# Patient Record
Sex: Female | Born: 1960 | Race: White | Hispanic: No | Marital: Married | Smoking: Never smoker
Health system: Southern US, Community
[De-identification: ages and names within clinical notes are randomized; demographics above are authoritative.]

## PROBLEM LIST (undated history)

## (undated) HISTORY — PX: HERNIA REPAIR: SHX51

## (undated) HISTORY — PX: BREAST BIOPSY: SHX20

---

## 2015-12-28 ENCOUNTER — Encounter: Payer: Self-pay | Admitting: *Deleted

## 2015-12-28 ENCOUNTER — Emergency Department
Admission: EM | Admit: 2015-12-28 | Discharge: 2015-12-28 | Disposition: A | Discharge status: Home / Self Care | Payer: BC Managed Care – PPO | Attending: Emergency Medicine | Admitting: Emergency Medicine

## 2015-12-28 ENCOUNTER — Emergency Department: Payer: BC Managed Care – PPO

## 2015-12-28 DIAGNOSIS — R0789 Other chest pain: Secondary | ICD-10-CM | POA: Diagnosis present

## 2015-12-28 DIAGNOSIS — Y9389 Activity, other specified: Secondary | ICD-10-CM | POA: Diagnosis not present

## 2015-12-28 DIAGNOSIS — Y999 Unspecified external cause status: Secondary | ICD-10-CM | POA: Insufficient documentation

## 2015-12-28 DIAGNOSIS — Y92410 Unspecified street and highway as the place of occurrence of the external cause: Secondary | ICD-10-CM | POA: Insufficient documentation

## 2015-12-28 LAB — TROPONIN I: Troponin I: <0.03 ng/mL (ref <0.031)

## 2015-12-28 MED ORDER — CYCLOBENZAPRINE HCL 10 MG PO TABS: 10.0000 mg | ORAL_TABLET | Freq: Three times a day (TID) | ORAL | Status: AC | PRN

## 2015-12-28 MED ORDER — NAPROXEN 500 MG PO TABS: 500.0000 mg | ORAL_TABLET | Freq: Two times a day (BID) | ORAL | Status: AC

## 2015-12-28 MED ORDER — CYCLOBENZAPRINE HCL 10 MG PO TABS
10.0000 mg | ORAL_TABLET | Freq: Once | ORAL | Status: AC
  Administered 2015-12-28: 10 mg via ORAL
  Filled 2015-12-28: qty 1

## 2015-12-28 NOTE — ED Notes (Signed)
Patient transported to Xray, will draw blood once returns.

## 2015-12-28 NOTE — ED Notes (Signed)
Lab called regarding trop, will process at this time.
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
## 2015-12-28 NOTE — Discharge Instructions (Signed)
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
## 2020-02-03 ENCOUNTER — Other Ambulatory Visit: Payer: Self-pay

## 2020-02-03 ENCOUNTER — Ambulatory Visit (LOCAL_COMMUNITY_HEALTH_CENTER): Payer: Self-pay

## 2020-02-03 DIAGNOSIS — Z111 Encounter for screening for respiratory tuberculosis: Secondary | ICD-10-CM

## 2020-02-06 ENCOUNTER — Ambulatory Visit (LOCAL_COMMUNITY_HEALTH_CENTER): Payer: Self-pay

## 2020-02-06 ENCOUNTER — Other Ambulatory Visit: Payer: Self-pay

## 2020-02-06 DIAGNOSIS — Z111 Encounter for screening for respiratory tuberculosis: Secondary | ICD-10-CM

## 2020-02-06 LAB — TB SKIN TEST
Induration: 0 mm
TB Skin Test: NEGATIVE

## 2020-08-18 ENCOUNTER — Other Ambulatory Visit: Payer: Self-pay | Admitting: Family Medicine

## 2020-08-18 DIAGNOSIS — Z1231 Encounter for screening mammogram for malignant neoplasm of breast: Secondary | ICD-10-CM

## 2024-04-15 ENCOUNTER — Other Ambulatory Visit: Payer: Self-pay | Admitting: Family Medicine

## 2024-04-15 DIAGNOSIS — Z1231 Encounter for screening mammogram for malignant neoplasm of breast: Secondary | ICD-10-CM

## 2024-05-13 ENCOUNTER — Ambulatory Visit
Admission: RE | Admit: 2024-05-13 | Discharge: 2024-05-13 | Disposition: A | Discharge status: Home / Self Care | Payer: Self-pay | Source: Ambulatory Visit | Attending: Family Medicine | Admitting: Family Medicine

## 2024-05-13 DIAGNOSIS — Z1231 Encounter for screening mammogram for malignant neoplasm of breast: Secondary | ICD-10-CM | POA: Insufficient documentation

## 2024-05-16 ENCOUNTER — Other Ambulatory Visit: Payer: Self-pay | Admitting: *Deleted

## 2024-05-16 ENCOUNTER — Inpatient Hospital Stay
Admission: RE | Admit: 2024-05-16 | Discharge: 2024-05-16 | Disposition: A | Discharge status: Home / Self Care | Payer: Self-pay | Source: Ambulatory Visit | Attending: Family Medicine | Admitting: Family Medicine

## 2024-05-16 DIAGNOSIS — Z1231 Encounter for screening mammogram for malignant neoplasm of breast: Secondary | ICD-10-CM
